# Patient Record
Sex: Female | Born: 2000 | Race: White | Hispanic: Yes | Marital: Married | Smoking: Never smoker
Health system: Southern US, Community
[De-identification: ages and names within clinical notes are randomized; demographics above are authoritative.]

## PROBLEM LIST (undated history)

## (undated) DIAGNOSIS — J3081 Allergic rhinitis due to animal (cat) (dog) hair and dander: Secondary | ICD-10-CM

## (undated) DIAGNOSIS — L700 Acne vulgaris: Secondary | ICD-10-CM

## (undated) DIAGNOSIS — L309 Dermatitis, unspecified: Secondary | ICD-10-CM

## (undated) DIAGNOSIS — K900 Celiac disease: Secondary | ICD-10-CM

## (undated) DIAGNOSIS — Z8619 Personal history of other infectious and parasitic diseases: Secondary | ICD-10-CM

## (undated) DIAGNOSIS — R519 Headache, unspecified: Secondary | ICD-10-CM

## (undated) HISTORY — DX: Acne vulgaris: L70.0

## (undated) HISTORY — DX: Dermatitis, unspecified: L30.9

## (undated) HISTORY — DX: Personal history of other infectious and parasitic diseases: Z86.19

## (undated) HISTORY — DX: Allergic rhinitis due to animal (cat) (dog) hair and dander: J30.81

## (undated) HISTORY — DX: Celiac disease: K90.0

---
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
## 2020-03-03 ENCOUNTER — Telehealth: Payer: Self-pay | Admitting: Pediatrics

## 2020-03-03 DIAGNOSIS — J3089 Other allergic rhinitis: Secondary | ICD-10-CM

## 2020-03-06 NOTE — Telephone Encounter (Signed)
Please schedule a WCC for 2-3 months. Only 3 month Rx given

## 2020-04-17 NOTE — Telephone Encounter (Signed)
[facility]

## 2020-12-04 ENCOUNTER — Ambulatory Visit (HOSPITAL_COMMUNITY)
Admission: RE | Admit: 2020-12-04 | Discharge: 2020-12-04 | Disposition: A | Discharge status: Home / Self Care | Payer: Medicaid Other | Source: Ambulatory Visit | Attending: Family Medicine | Admitting: Family Medicine

## 2020-12-04 ENCOUNTER — Other Ambulatory Visit (HOSPITAL_COMMUNITY): Payer: Self-pay | Admitting: Family Medicine

## 2020-12-04 ENCOUNTER — Other Ambulatory Visit: Payer: Self-pay

## 2020-12-04 DIAGNOSIS — M25512 Pain in left shoulder: Secondary | ICD-10-CM | POA: Diagnosis not present

## 2021-12-10 ENCOUNTER — Other Ambulatory Visit (HOSPITAL_COMMUNITY): Payer: Self-pay | Admitting: Family Medicine

## 2021-12-10 DIAGNOSIS — L729 Follicular cyst of the skin and subcutaneous tissue, unspecified: Secondary | ICD-10-CM

## 2021-12-26 ENCOUNTER — Ambulatory Visit (HOSPITAL_COMMUNITY)
Admission: RE | Admit: 2021-12-26 | Discharge: 2021-12-26 | Disposition: A | Discharge status: Home / Self Care | Payer: 59 | Source: Ambulatory Visit | Attending: Family Medicine | Admitting: Family Medicine

## 2021-12-26 DIAGNOSIS — L729 Follicular cyst of the skin and subcutaneous tissue, unspecified: Secondary | ICD-10-CM | POA: Insufficient documentation

## 2022-03-17 IMAGING — DX DG SCAPULA*L*
2 series · 2 of 2 positions shown · non-contrast
Comparison: None.

CLINICAL DATA: Left shoulder pain.  Unspecified chronicity

EXAM:
LEFT SCAPULA - 2+ VIEWS

[scapula ap]
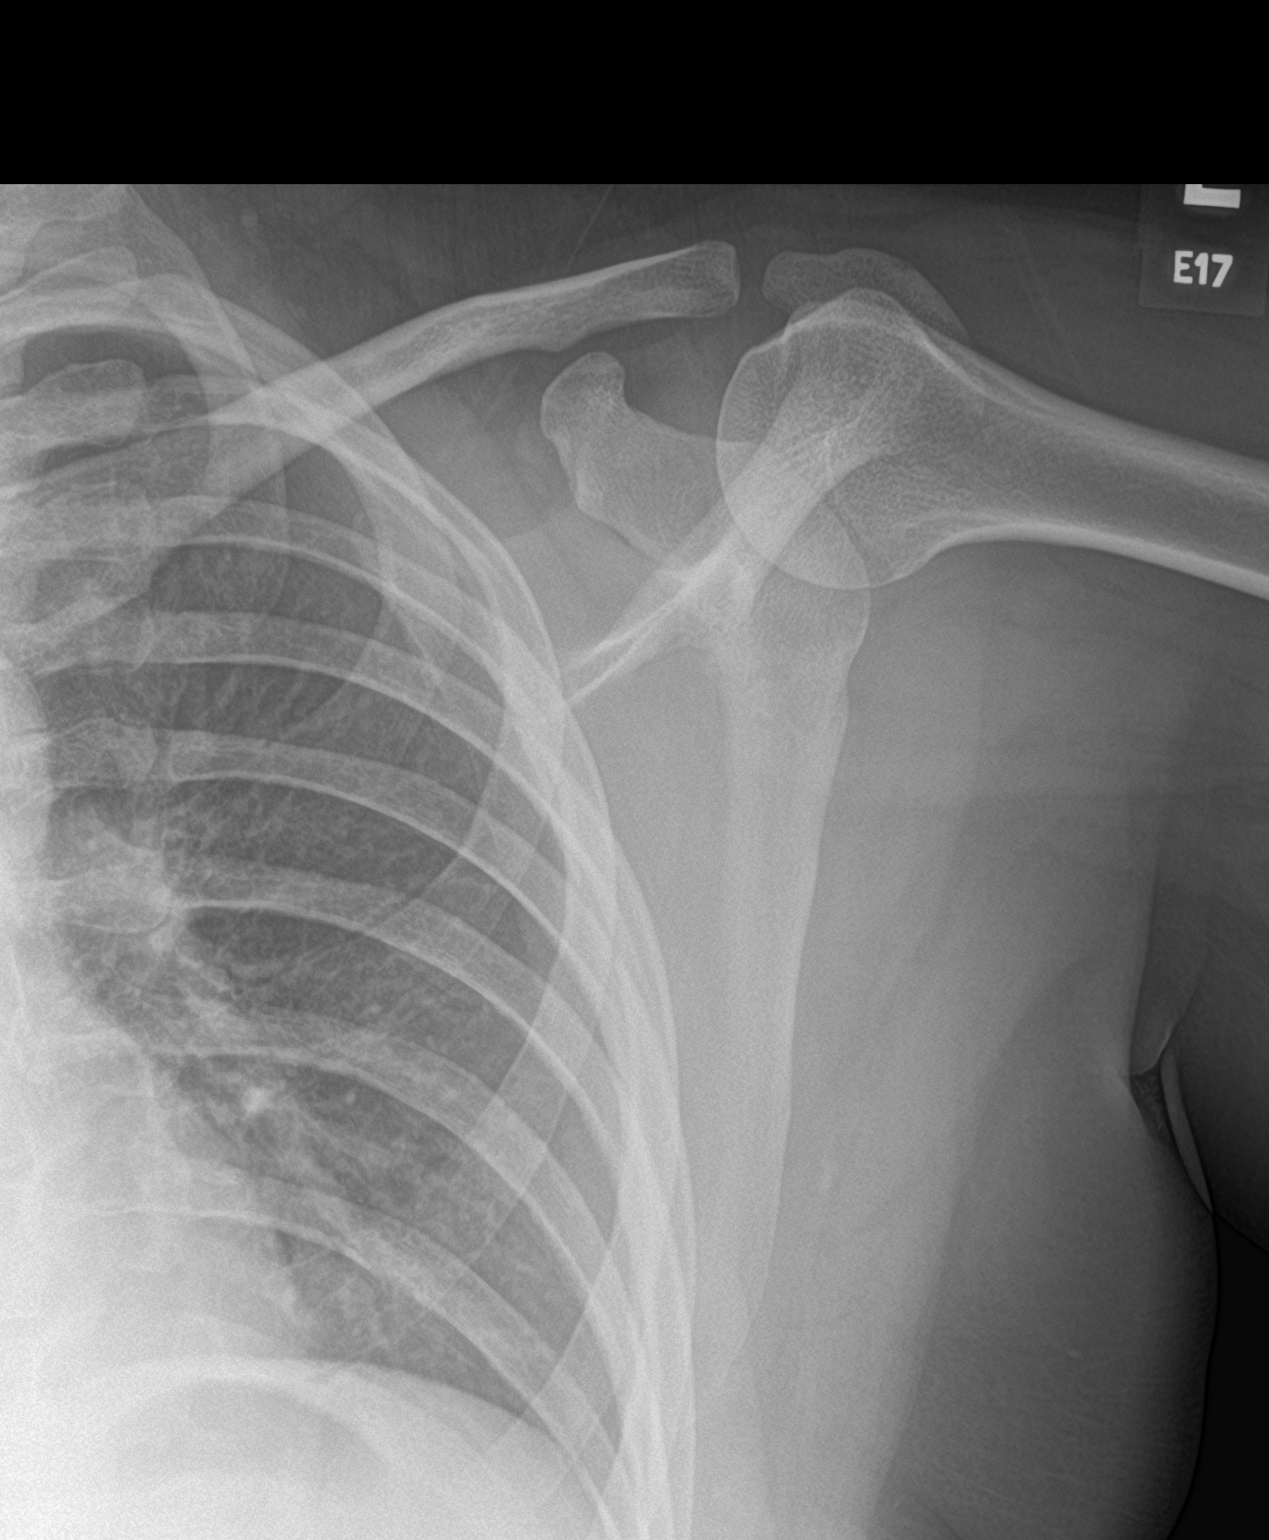

[scapula lat]
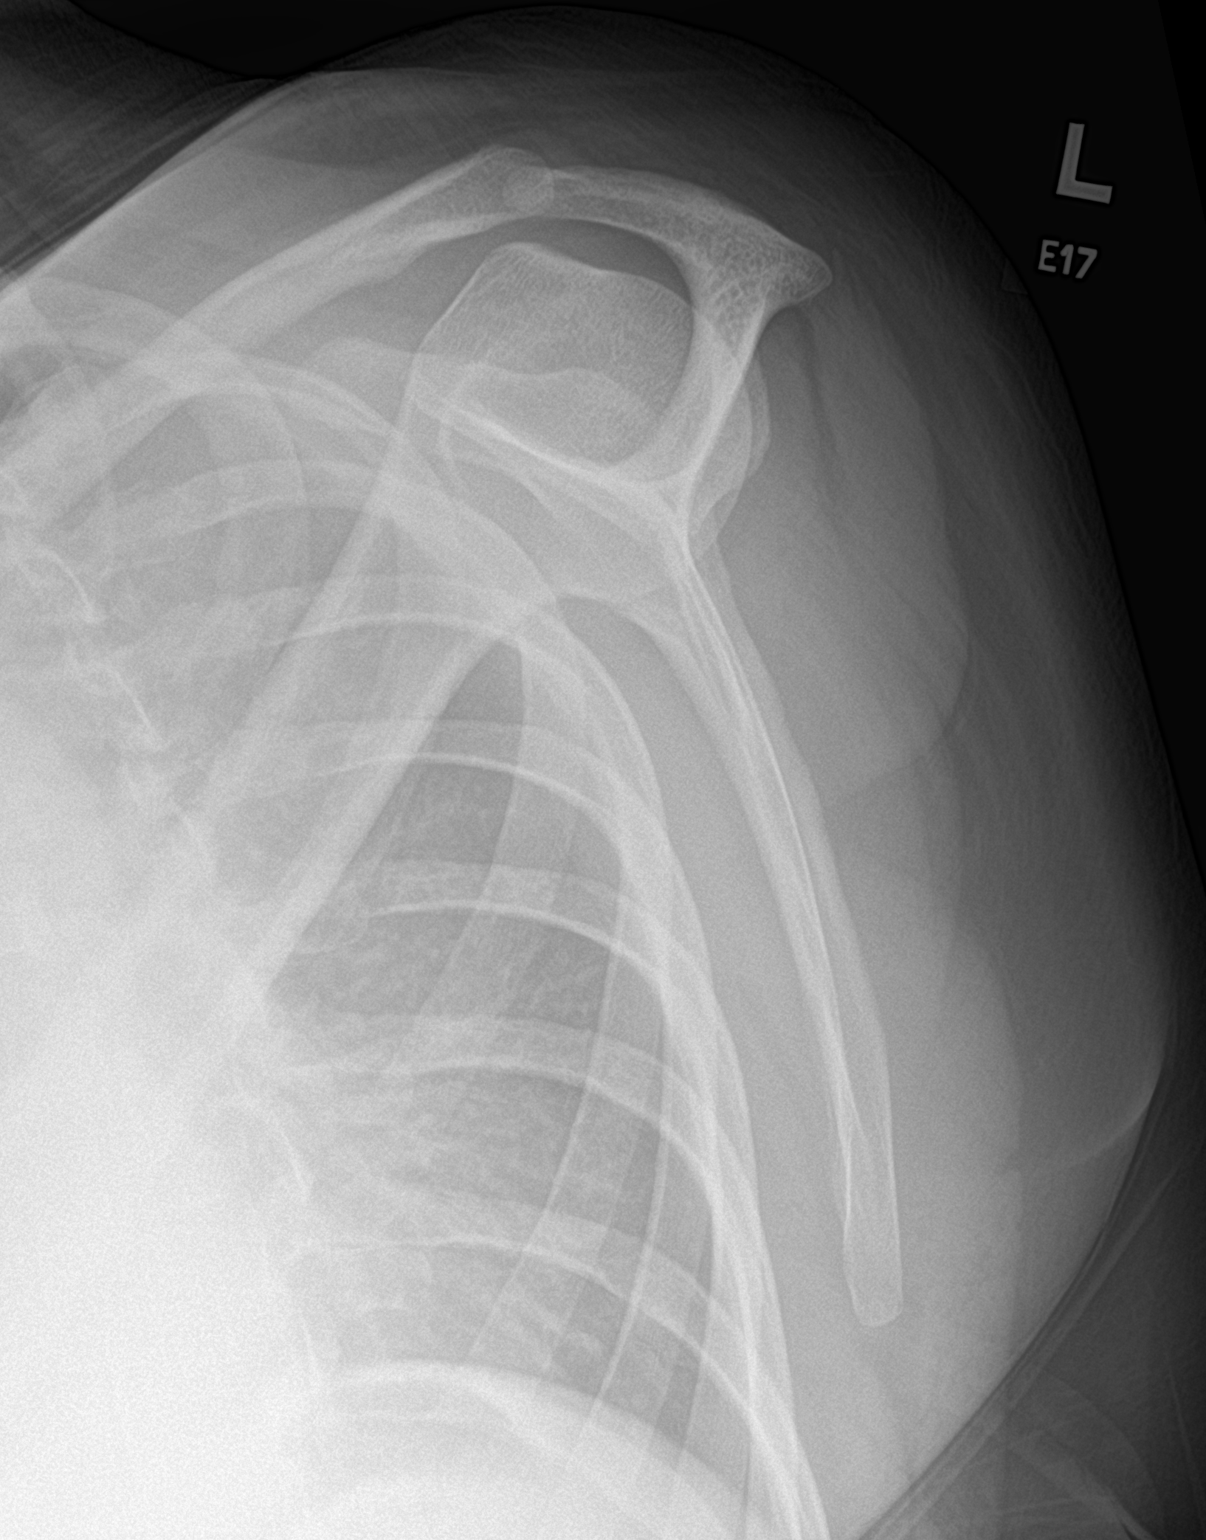

[2 of 2 positions shown; findings below may reference images not displayed]

FINDINGS: There is no evidence of fracture or other focal bone lesions. Soft
tissues are unremarkable.
IMPRESSION: Negative.

## 2022-03-20 ENCOUNTER — Ambulatory Visit: Payer: Managed Care, Other (non HMO) | Admitting: Plastic Surgery

## 2022-03-20 ENCOUNTER — Encounter: Payer: Self-pay | Admitting: Plastic Surgery

## 2022-03-20 VITALS — BP 144/92 | HR 84 | Ht 67.0 in | Wt 247.6 lb

## 2022-03-20 DIAGNOSIS — Z6838 Body mass index (BMI) 38.0-38.9, adult: Secondary | ICD-10-CM

## 2022-03-20 DIAGNOSIS — M546 Pain in thoracic spine: Secondary | ICD-10-CM | POA: Diagnosis not present

## 2022-03-20 DIAGNOSIS — M542 Cervicalgia: Secondary | ICD-10-CM | POA: Diagnosis not present

## 2022-03-20 DIAGNOSIS — N62 Hypertrophy of breast: Secondary | ICD-10-CM | POA: Diagnosis not present

## 2022-03-20 NOTE — Progress Notes (Signed)
Referring Provider Valentino Nose, FNP [address] [place],  [place] [address]   CC:  Chief Complaint  Patient presents with   Advice Only      Bonnie Mullins is an 22 y.o. female.  HPI: Bonnie Mullins is a 22 year old female who presents today with complaints of upper back and neck pain which Bonnie Mullins has had for at least 2 years and attributes to the large size of her breast.  Bonnie Mullins has been seeing an orthopedic specialist who feels that Bonnie Mullins is probably correct that the large size of her breast is contributing to her back pain.  Bonnie Mullins would like to have a bilateral breast reduction.  No Known Allergies  Outpatient Encounter Medications as of 03/20/2022  Medication Sig   cetirizine (ZYRTEC) 10 MG tablet Take 1 tablet by mouth once daily   rizatriptan (MAXALT) 10 MG tablet as needed for migraine.   No facility-administered encounter medications on file as of 03/20/2022.     Past Medical History:  Diagnosis Date   Acne vulgaris    Allergic rhinitis due to animal (cat) (dog) hair and dander    Bursitis of hip 12/2018   left   Eczema    Headache    History of chicken pox     Past Surgical History:  Procedure Laterality Date   DENTAL SURGERY  2016   NASAL SEPTOPLASTY W/ TURBINOPLASTY Bilateral 01/01/2019   Procedure: NASAL SEPTOPLASTY WITH BILATERAL TURBINATE REDUCTION;  Surgeon: Leta Baptist, MD;  Location: [facility];  Service: ENT;  Laterality: Bilateral;   PATENT DUCTUS ARTERIOUS REPAIR  01/2001   Ligation    Family History  Problem Relation Age of Onset   Diabetes type I Mother    Diabetes type II Father     Social History   Social History Narrative   Not on file     Review of Systems General: Denies fevers, chills, weight loss CV: Denies chest pain, shortness of breath, palpitations Breast: Large breasts contributing to her upper back and neck pain.  Bonnie Mullins also notes that Bonnie Mullins has rashes especially in the summer when it is warm outside and Bonnie Mullins is  having increased sweating.  Physical Exam    03/20/2022    1:39 PM 01/01/2019    9:45 AM 01/01/2019    9:15 AM  Vitals with BMI  Height '5\' 7"'$     Weight 247 lbs 10 oz    BMI [id number]    Systolic [id number]  Diastolic 92 86 82  Pulse 84 90 93    General:  No acute distress,  Alert and oriented, Non-Toxic, Normal speech and affect Breast: Patient has very large breasts with grade 3 ptosis.  Bonnie Mullins has no dominant masses and the nipples are normal in appearance with no nipple discharge.  Her sternal notch to nipple distance is 36 on the right and 37 on the left the fold to nipple distance on the right is 15 and 15 on the left Mammogram: Bonnie Mullins has not begun mammographic screening Assessment/Plan Symptomatic macromastia: This is a 22 year old female with greater than 2 years of upper back and neck pain that is most likely at least partially caused by the large size of her breast.  We discussed breast reductions including the location of the incisions and the unpredictable nature of scarring we discussed the risks of bleeding, infection, and seroma formation.  Bonnie Mullins understands I will use drains postoperatively.  We discussed the risks of loss of nipple due  to nipple ischemia and changes in feeling in the nipples.  We discussed the likelihood that Bonnie Mullins will have some change in feeling in the breast skin.  We discussed at length the possibility that her breast will increase in size if Bonnie Mullins becomes pregnant in the future.  We also discussed the possibility that Bonnie Mullins will not be able to successfully breast-feed after breast reduction.  We discussed the physical limitations after surgery including no heavy lifting greater than 20 pounds no vigorous activity and no submerging the incisions in water.  All questions were answered to her satisfaction.  Photographs were obtained with her consent.  Will submit for a bilateral breast reduction with a reduction weight of 900 to 1000 g per breast.  [name] 03/20/2022,  2:00 PM

## 2022-04-25 ENCOUNTER — Telehealth: Payer: Self-pay

## 2022-04-25 NOTE — Telephone Encounter (Addendum)
Faxed notes, MRI results, photos to [facility]. Pending  [id number].  Received fax confirmation.

## 2022-04-29 ENCOUNTER — Encounter: Payer: Self-pay | Admitting: *Deleted

## 2022-04-29 ENCOUNTER — Telehealth: Payer: Self-pay

## 2022-04-29 NOTE — Telephone Encounter (Signed)
Spoke with Candise Bowens at [facility] on 04/26/2022 ref#[id number]. Breast reduction is excluded from patients plan.  04/29/2022- tried calling patient on [phone] but, the number was invalid.  Also called [phone] and left a message on VM to call our office at [phone].  Sent message via MyChart in regards to the submission for the breast reduction is excluded under patients plan.  There is a Scientist, research (physical sciences) that she can call, and they can answer question and provide more information. [phone]

## 2022-04-29 NOTE — Telephone Encounter (Signed)
Returned patient call. She is aware that her insurance excludes breast reduction.  Advised we can send her a  quote via Mychart

## 2022-12-24 ENCOUNTER — Other Ambulatory Visit (HOSPITAL_COMMUNITY)
Admission: RE | Admit: 2022-12-24 | Discharge: 2022-12-24 | Disposition: A | Discharge status: Home / Self Care | Payer: Managed Care, Other (non HMO) | Source: Ambulatory Visit | Attending: Women's Health | Admitting: Women's Health

## 2022-12-24 ENCOUNTER — Encounter: Payer: Self-pay | Admitting: Women's Health

## 2022-12-24 ENCOUNTER — Ambulatory Visit: Payer: Managed Care, Other (non HMO) | Admitting: Women's Health

## 2022-12-24 VITALS — BP 126/86 | HR 77 | Ht 67.0 in | Wt 256.0 lb

## 2022-12-24 DIAGNOSIS — Z01419 Encounter for gynecological examination (general) (routine) without abnormal findings: Secondary | ICD-10-CM

## 2022-12-24 NOTE — Addendum Note (Signed)
Addended by: Federico Flake A on: 12/24/2022 10:24 AM   Modules accepted: Orders

## 2022-12-24 NOTE — Progress Notes (Signed)
   GYN VISIT- Pap & Breast Exam Only Patient name: Bonnie Mullins MRN [id number]  Date of birth: 22-May-2000 Chief Complaint:   new gyn (Annual/ pap, first pap)  History of Present Illness:   Bonnie Mullins is a 22 y.o. G0P0000 Caucasian female being seen today for pap & breast exam only. Annual w/ PCP in Nov. No issues, normal periods. Condoms for birth control and is good w/ that, doesn't plan pregnancy right now.    Patient's last menstrual period was 12/17/2022. The current method of family planning is condoms.  Last pap never. Results were: N/A     12/24/2022    9:49 AM  Depression screen PHQ 2/9  Decreased Interest 0  Down, Depressed, Hopeless 0  PHQ - 2 Score 0  Altered sleeping 0  Tired, decreased energy 0  Change in appetite 0  Feeling bad or failure about yourself  0  Trouble concentrating 0  Moving slowly or fidgety/restless 0  Suicidal thoughts 0  PHQ-9 Score 0        12/24/2022    9:49 AM  GAD 7 : Generalized Anxiety Score  Nervous, Anxious, on Edge 1  Control/stop worrying 0  Worry too much - different things 0  Trouble relaxing 0  Restless 0  Easily annoyed or irritable 0  Afraid - awful might happen 0  Total GAD 7 Score 1     Review of Systems:   Pertinent items are noted in HPI Denies fever/chills, dizziness, headaches, visual disturbances, fatigue, shortness of breath, chest pain, abdominal pain, vomiting, abnormal vaginal discharge/itching/odor/irritation, problems with periods, bowel movements, urination, or intercourse unless otherwise stated above.  Pertinent History Reviewed:  Reviewed past medical,surgical, social, obstetrical and family history.  Reviewed problem list, medications and allergies. Physical Assessment:   Vitals:   12/24/22 0947  BP: 126/86  Pulse: 77  Weight: 256 lb (116.1 kg)  Height: 5\' 7"  (1.702 m)  Body mass index is 40.1 kg/m.       Physical Examination:   General appearance: alert, well appearing, and in  no distress  Mental status: alert, oriented to person, place, and time  Skin: warm & dry   Cardiovascular: normal heart rate noted  Respiratory: normal respiratory effort, no distress  Breasts - breasts appear normal, no suspicious masses, no skin or nipple changes or axillary nodes   Abdomen: soft, non-tender   Pelvic: normal external genitalia, vulva, vagina, cervix, uterus and adnexa  Extremities: no edema   Chaperone: Peggy Dones    No results found for this or any previous visit (from the past 24 hour(s)).  Assessment & Plan:  1) Screening for cervical cancer> pap today   2) STD screen> gc/ct on pap  Meds: No orders of the defined types were placed in this encounter.   No orders of the defined types were placed in this encounter.   Return in about 1 year (around 12/24/2023) for Physical.  Cheral Marker CNM, WHNP-BC 12/24/2022 10:15 AM

## 2022-12-26 LAB — CYTOLOGY - PAP
Chlamydia: NEGATIVE
Comment: NEGATIVE
Comment: NEGATIVE
Comment: NORMAL
Diagnosis: NEGATIVE
High risk HPV: NEGATIVE
Neisseria Gonorrhea: NEGATIVE

## 2023-01-01 NOTE — Therapy (Signed)
OUTPATIENT PHYSICAL THERAPY VESTIBULAR EVALUATION     Patient Name: Bonnie Mullins MRN: [id number] DOB:11/05/2000, 22 y.o., female Today's Date: 01/01/2023  END OF SESSION:   Past Medical History:  Diagnosis Date   Acne vulgaris    Allergic rhinitis due to animal (cat) (dog) hair and dander    Bursitis of hip 12/2018   left   Celiac disease    Eczema    Headache    History of chicken pox    Past Surgical History:  Procedure Laterality Date   DENTAL SURGERY  2016   NASAL SEPTOPLASTY W/ TURBINOPLASTY Bilateral 01/01/2019   Procedure: NASAL SEPTOPLASTY WITH BILATERAL TURBINATE REDUCTION;  Surgeon: Newman Pies, MD;  Location: [facility];  Service: ENT;  Laterality: Bilateral;   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  ONSET DATE: ***  Rationale for Evaluation and Treatment: {HABREHAB:27488}  SUBJECTIVE:   SUBJECTIVE STATEMENT: *** Pt accompanied by: {accompnied:27141}  PERTINENT HISTORY: ***  PAIN:  Are you having pain? {OPRCPAIN:27236}  PRECAUTIONS: {Therapy precautions:24002}  RED FLAGS: {PT Red Flags:29287}   WEIGHT BEARING RESTRICTIONS: {Yes ***/No:24003}  FALLS: Has patient fallen in last 6 months? {fallsyesno:27318}  LIVING ENVIRONMENT: Lives with: {OPRC lives with:25569::"lives with their family"} Lives in: {Lives in:25570} Stairs: {opstairs:27293} Has following equipment at home: {Assistive devices:23999}  PLOF: {PLOF:24004}  PATIENT GOALS: ***  OBJECTIVE:  Note: Objective measures were completed at Evaluation unless otherwise noted.  DIAGNOSTIC FINDINGS: ***  COGNITION: Overall cognitive status:  {cognition:24006}   SENSATION: {sensation:27233}  EDEMA:  {edema:24020}  MUSCLE TONE:  {LE tone:25568}  DTRs:  {DTR SITE:24025}  POSTURE:  {posture:25561}  Cervical ROM:    {AROM/PROM:27142} A/PROM (deg) eval  Flexion   Extension   Right lateral flexion   Left lateral flexion   Right rotation   Left rotation   (Blank rows = not tested)  STRENGTH: ***  LOWER EXTREMITY MMT:   MMT Right eval Left eval  Hip flexion    Hip abduction    Hip adduction    Hip internal rotation    Hip external rotation    Knee flexion    Knee extension    Ankle dorsiflexion    Ankle plantarflexion    Ankle inversion    Ankle eversion    (Blank rows = not tested)  BED MOBILITY:  {Bed mobility:24027}  TRANSFERS: Assistive device utilized: {Assistive devices:23999}  Sit to stand: {Levels of assistance:24026} Stand to sit: {Levels of assistance:24026} Chair to chair: {Levels of assistance:24026} Floor: {Levels of assistance:24026}  RAMP: {Levels of assistance:24026}  CURB: {Levels of assistance:24026}  GAIT: Gait pattern: {gait characteristics:25376} Distance walked: *** Assistive device utilized: {Assistive devices:23999} Level of assistance: {Levels of assistance:24026} Comments: ***  FUNCTIONAL TESTS:  {Functional tests:24029}  PATIENT SURVEYS:  {rehab surveys:24030}  VESTIBULAR ASSESSMENT:  GENERAL OBSERVATION: ***   SYMPTOM BEHAVIOR:  Subjective history: ***  Non-Vestibular symptoms: {nonvestibular symptoms:25260}  Type of dizziness: {Type of Dizziness:25255}  Frequency: ***  Duration: ***  Aggravating factors: {Aggravating Factors:25258}  Relieving factors: {Relieving Factors:25259}  Progression of symptoms: {DESC; BETTER/WORSE:18575}  OCULOMOTOR EXAM:  Ocular Alignment: {Ocular Alignment:25262}  Ocular ROM: {RANGE OF MOTION:21649}  Spontaneous Nystagmus: {Spontaneous nystagmus:25263}  Gaze-Induced Nystagmus: {gaze-induced  nystagmus:25264}  Smooth Pursuits: {smooth pursuit:25265}  Saccades: {saccades:25266}  Convergence/Divergence: *** cm   FRENZEL - FIXATION SUPRESSED:  Ocular Alignment: {Ocular Alignment:25262}  Spontaneous Nystagmus: {Spontaneous nystagmus:25263}  Gaze-Induced Nystagmus: {gaze-induced nystagmus:25264}  Horizontal head shaking - induced nystagmus: {head shaking induced nystagmus:25267}  Vertical head shaking - induced nystagmus: {head shaking induced nystagmus:25267}  Positional tests: {Positional tests:25271}  Pressure tests: {frenzel pressure tests:25268}  VESTIBULAR - OCULAR REFLEX:   Slow VOR: {slow VOR:25290}  VOR Cancellation: {vor cancellation:25291}  Head-Impulse Test: {head impulse test:25272}  Dynamic Visual Acuity: {dynamic visual acuity:25273}   POSITIONAL TESTING: {Positional tests:25271}  MOTION SENSITIVITY:  Motion Sensitivity Quotient Intensity: 0 = none, 1 = Lightheaded, 2 = Mild, 3 = Moderate, 4 = Severe, 5 = Vomiting  Intensity  1. Sitting to supine   2. Supine to L side   3. Supine to R side   4. Supine to sitting   5. L Hallpike-Dix   6. Up from L    7. R Hallpike-Dix   8. Up from R    9. Sitting, head tipped to L knee   10. Head up from L knee   11. Sitting, head tipped to R knee   12. Head up from R knee   13. Sitting head turns x5   14.Sitting head nods x5   15. In stance, 180 turn to L    16. In stance, 180 turn to R     OTHOSTATICS: {Exam; orthostatics:31331}  FUNCTIONAL GAIT: {Functional tests:24029}   VESTIBULAR TREATMENT:                                                                                                   DATE: ***  Canalith Repositioning:  {Canalith Repositioning:25283} Gaze Adaptation:  {gaze adaptation:25286} Habituation:  {habituation:25288} Other: ***  PATIENT EDUCATION: Education details: *** Person educated: {Person educated:25204} Education method: {Education Method:25205} Education comprehension:  {Education Comprehension:25206}  HOME EXERCISE PROGRAM:  GOALS: Goals reviewed with patient? {yes/no:20286}  SHORT TERM GOALS: Target date: ***  *** Baseline: Goal status: {GOALSTATUS:25110}  2.  *** Baseline:  Goal status: {GOALSTATUS:25110}  3.  *** Baseline:  Goal status: {GOALSTATUS:25110}  4.  *** Baseline:  Goal status: {GOALSTATUS:25110}  5.  *** Baseline:  Goal status: {GOALSTATUS:25110}  6.  *** Baseline:  Goal status: {GOALSTATUS:25110}  LONG TERM GOALS: Target date: ***  *** Baseline:  Goal status: {GOALSTATUS:25110}  2.  *** Baseline:  Goal status: {GOALSTATUS:25110}  3.  *** Baseline:  Goal status: {GOALSTATUS:25110}  4.  *** Baseline:  Goal status: {GOALSTATUS:25110}  5.  *** Baseline:  Goal status: {GOALSTATUS:25110}  6.  *** Baseline:  Goal status: {GOALSTATUS:25110}  ASSESSMENT:  CLINICAL IMPRESSION: Patient is a *** y.o. *** who was seen today for physical therapy evaluation and treatment for ***.   OBJECTIVE IMPAIRMENTS: {opptimpairments:25111}.   ACTIVITY LIMITATIONS: {activitylimitations:27494}  PARTICIPATION LIMITATIONS: {participationrestrictions:25113}  PERSONAL FACTORS: {Personal factors:25162} are also affecting patient's functional outcome.   REHAB POTENTIAL: {rehabpotential:25112}  CLINICAL DECISION MAKING: {clinical decision making:25114}  EVALUATION COMPLEXITY: {Evaluation complexity:25115}   PLAN:  PT FREQUENCY: {rehab frequency:25116}  PT DURATION: {  rehab duration:25117}  PLANNED INTERVENTIONS: {rehab planned interventions:25118::"97110-Therapeutic exercises","97530- Therapeutic [phone]- Neuromuscular re-education","97535- Self FAOZ","30865- Manual therapy"}  PLAN FOR NEXT SESSION: ***   Marisue Brooklyn, PT 01/01/2023, 3:15 PM

## 2023-01-02 ENCOUNTER — Other Ambulatory Visit: Payer: Self-pay

## 2023-01-02 ENCOUNTER — Ambulatory Visit (HOSPITAL_COMMUNITY): Payer: Managed Care, Other (non HMO) | Attending: Internal Medicine

## 2023-01-02 DIAGNOSIS — R42 Dizziness and giddiness: Secondary | ICD-10-CM
